# Patient Record
Sex: Female | Born: 1970
Health system: Southern US, Community
[De-identification: ages and names within clinical notes are randomized; demographics above are authoritative.]

## PROBLEM LIST (undated history)

## (undated) DIAGNOSIS — F3181 Bipolar II disorder: Secondary | ICD-10-CM

## (undated) DIAGNOSIS — F909 Attention-deficit hyperactivity disorder, unspecified type: Secondary | ICD-10-CM

---

## 2007-12-29 ENCOUNTER — Encounter: Payer: Self-pay | Admitting: Family Medicine

## 2007-12-29 ENCOUNTER — Other Ambulatory Visit: Admission: RE | Admit: 2007-12-29 | Discharge: 2007-12-29 | Payer: Self-pay | Admitting: Family Medicine

## 2007-12-29 ENCOUNTER — Ambulatory Visit: Payer: Self-pay | Admitting: Family Medicine

## 2007-12-29 DIAGNOSIS — F3189 Other bipolar disorder: Secondary | ICD-10-CM | POA: Insufficient documentation

## 2007-12-29 DIAGNOSIS — D239 Other benign neoplasm of skin, unspecified: Secondary | ICD-10-CM | POA: Insufficient documentation

## 2007-12-29 DIAGNOSIS — B070 Plantar wart: Secondary | ICD-10-CM

## 2008-01-01 ENCOUNTER — Ambulatory Visit: Payer: Self-pay | Admitting: Family Medicine

## 2008-01-06 ENCOUNTER — Encounter (INDEPENDENT_AMBULATORY_CARE_PROVIDER_SITE_OTHER): Payer: Self-pay | Admitting: *Deleted

## 2008-01-24 LAB — CONVERTED CEMR LAB
Albumin: 4.2 g/dL (ref 3.5–5.2)
Alkaline Phosphatase: 53 units/L (ref 39–117)
BUN: 14 mg/dL (ref 6–23)
Calcium: 9.6 mg/dL (ref 8.4–10.5)
Eosinophils Absolute: 0 10*3/uL (ref 0.0–0.7)
Eosinophils Relative: 1 % (ref 0.0–5.0)
GFR calc Af Amer: 92 mL/min
GFR calc non Af Amer: 76 mL/min
HCT: 39.4 % (ref 36.0–46.0)
HDL: 49.8 mg/dL (ref >39.0)
MCV: 94 fL (ref 78.0–100.0)
Monocytes Absolute: 0.3 10*3/uL (ref 0.1–1.0)
Platelets: 179 10*3/uL (ref 150–400)
Potassium: 3.6 meq/L (ref 3.5–5.1)
RDW: 11.7 % (ref 11.5–14.6)
Sodium: 136 meq/L (ref 135–145)
TSH: 0.97 microintl units/mL (ref 0.35–5.50)
Triglycerides: 63 mg/dL (ref 0–149)

## 2008-01-27 ENCOUNTER — Encounter (INDEPENDENT_AMBULATORY_CARE_PROVIDER_SITE_OTHER): Payer: Self-pay | Admitting: *Deleted

## 2008-01-28 ENCOUNTER — Telehealth (INDEPENDENT_AMBULATORY_CARE_PROVIDER_SITE_OTHER): Payer: Self-pay | Admitting: *Deleted

## 2017-11-22 DIAGNOSIS — F909 Attention-deficit hyperactivity disorder, unspecified type: Secondary | ICD-10-CM | POA: Insufficient documentation

## 2017-11-22 DIAGNOSIS — F411 Generalized anxiety disorder: Secondary | ICD-10-CM | POA: Insufficient documentation

## 2017-12-03 ENCOUNTER — Ambulatory Visit: Payer: 59 | Admitting: Psychiatry

## 2017-12-03 ENCOUNTER — Encounter: Payer: Self-pay | Admitting: Psychiatry

## 2017-12-03 DIAGNOSIS — R7989 Other specified abnormal findings of blood chemistry: Secondary | ICD-10-CM

## 2017-12-03 DIAGNOSIS — F411 Generalized anxiety disorder: Secondary | ICD-10-CM

## 2017-12-03 DIAGNOSIS — F3181 Bipolar II disorder: Secondary | ICD-10-CM | POA: Diagnosis not present

## 2017-12-03 DIAGNOSIS — F902 Attention-deficit hyperactivity disorder, combined type: Secondary | ICD-10-CM

## 2017-12-03 MED ORDER — AMPHETAMINE-DEXTROAMPHETAMINE 20 MG PO TABS: 20.0000 mg | ORAL_TABLET | ORAL | 0 refills | Status: DC

## 2017-12-03 MED ORDER — LISDEXAMFETAMINE DIMESYLATE 50 MG PO CAPS: 50.0000 mg | ORAL_CAPSULE | ORAL | 0 refills | Status: DC

## 2017-12-03 MED ORDER — LAMOTRIGINE 150 MG PO TABS: 150.0000 mg | ORAL_TABLET | Freq: Every day | ORAL | 3 refills | Status: DC

## 2017-12-03 NOTE — Progress Notes (Signed)
[facility]  Patient ID: Ariana Montes,  MRN: [id number]  PCP: Ann Held, DO  Date of Evaluation: 12/03/2017 Time spent:20 minutes   HISTORY/CURRENT STATUS: HPI  FU psych dxes, seasonality noticed Notices ADD symptoms And wonders about longer acting stimulant.  However she is getting by with the current one and does not want to change Notices seasonal mood changes not severe. Patient reports stable mood and denies depressed or irritable moods.  Patient denies any recent difficulty with anxiety.  Patient denies difficulty with sleep initiation or maintenance. Denies appetite disturbance.  Patient reports that energy and motivation have been OK except a little seasonal drop.  Patient denies any difficulty with concentration.  Patient denies any suicidal ideation. Individual Medical History/ Review of Systems: Changes? :No  Allergies: Penicillins and Sulfa antibiotics  Current Medications:  Current Outpatient Medications:  .  amphetamine-dextroamphetamine (ADDERALL) 20 MG tablet, Take 20 mg by mouth every morning., Disp: , Rfl:  .  lamoTRIgine (LAMICTAL) 150 MG tablet, Take 150 mg by mouth daily., Disp: , Rfl:  .  lisdexamfetamine (VYVANSE) 50 MG capsule, Take 50 mg by mouth every morning., Disp: , Rfl:  Medication Side Effects: None  Family Medical/ Social History: Changes? No  MENTAL HEALTH EXAM:  There were no vitals taken for this visit.There is no height or weight on file to calculate BMI.  General Appearance: Casual  Eye Contact:  Good  Speech:  Normal Rate  Volume:  Normal  Mood:  Euthymic  Affect:  Appropriate and Congruent  Thought Process:  Goal Directed  Orientation:  Full (Time, Place, and Person)  Thought Content: Logical   Suicidal Thoughts:  No  Homicidal Thoughts:  No  Memory:  Recent  Judgement:  Good  Insight:  Good  Psychomotor Activity:  Normal  Concentration:  Concentration: Good  Recall:  Good  Fund of Knowledge: Good   Language: Good  Akathisia:  No  AIMS (if indicated): not done  Assets:  Communication Skills Desire for Improvement Financial Resources/Insurance Housing Intimacy Leisure Time Resilience Social Support Talents/Skills Transportation  ADL's:  Intact  Cognition: WNL  Prognosis:  Good    DIAGNOSES:    ICD-10-CM   1. Bipolar II disorder ([place]) F31.81   2. Generalized anxiety disorder F41.1   3. Low vitamin D level R79.89   4. Attention deficit hyperactivity disorder (ADHD), combined type F90.2     RECOMMENDATIONS: Greater than 50% of face to face time with patient was spent on counseling and coordination of care. We discussed Strong seasonality in mood and attention. Hx low vitamin D contributes to seasonal depressison.  Check level. Then add vitamin D .  Replace bulbs in her light box  Then use it during winter.  Handout given on it's use. Discussed alternatives in stimulants to go to one long acting..   FU 6 mos    Purnell Shoemaker, MD

## 2017-12-08 LAB — VITAMIN D 1,25 DIHYDROXY
Vitamin D 1, 25 (OH)2 Total: 50 pg/mL
Vitamin D3 1, 25 (OH)2: 50 pg/mL

## 2017-12-09 ENCOUNTER — Telehealth: Payer: Self-pay | Admitting: Psychiatry

## 2017-12-09 NOTE — Telephone Encounter (Signed)
Vitamin D level on supplementation is 50.  That's good.  Keep taking it. No change.

## 2017-12-11 ENCOUNTER — Telehealth: Payer: Self-pay

## 2017-12-11 NOTE — Telephone Encounter (Signed)
Called pt with Vitamin D level at 50, continue taking supplement per provider. Pt verbalized understanding.

## 2018-03-31 ENCOUNTER — Other Ambulatory Visit: Payer: Self-pay

## 2018-03-31 MED ORDER — LISDEXAMFETAMINE DIMESYLATE 50 MG PO CAPS: 50.0000 mg | ORAL_CAPSULE | ORAL | 0 refills | Status: DC

## 2018-03-31 MED ORDER — AMPHETAMINE-DEXTROAMPHETAMINE 20 MG PO TABS: 20.0000 mg | ORAL_TABLET | ORAL | 0 refills | Status: DC

## 2018-03-31 NOTE — Telephone Encounter (Signed)
Received refill request from [facility] for pt's Vyvanse and Amphetamine be submitted for 90 day supply.

## 2018-06-04 ENCOUNTER — Encounter: Payer: Self-pay | Admitting: Psychiatry

## 2018-06-04 ENCOUNTER — Ambulatory Visit: Payer: 59 | Admitting: Psychiatry

## 2018-06-04 ENCOUNTER — Other Ambulatory Visit: Payer: Self-pay

## 2018-06-04 DIAGNOSIS — F3181 Bipolar II disorder: Secondary | ICD-10-CM

## 2018-06-04 DIAGNOSIS — R7989 Other specified abnormal findings of blood chemistry: Secondary | ICD-10-CM | POA: Diagnosis not present

## 2018-06-04 DIAGNOSIS — G4721 Circadian rhythm sleep disorder, delayed sleep phase type: Secondary | ICD-10-CM

## 2018-06-04 DIAGNOSIS — F902 Attention-deficit hyperactivity disorder, combined type: Secondary | ICD-10-CM

## 2018-06-04 DIAGNOSIS — F411 Generalized anxiety disorder: Secondary | ICD-10-CM | POA: Diagnosis not present

## 2018-06-04 MED ORDER — LISDEXAMFETAMINE DIMESYLATE 50 MG PO CAPS: 50.0000 mg | ORAL_CAPSULE | ORAL | 0 refills | Status: DC

## 2018-06-04 NOTE — Progress Notes (Signed)
Ariana Montes [id number] [birth year] 48 y.o.  Virtual Visit via Telephone Note  I connected with pt by telephone and verified that I am speaking with the correct person using two identifiers.   I discussed the limitations, risks, security and privacy concerns of performing an evaluation and management service by telephone and the availability of in person appointments. I also discussed with the patient that there may be a patient responsible charge related to this service. The patient expressed understanding and agreed to proceed.  I discussed the assessment and treatment plan with the patient. The patient was provided an opportunity to ask questions and all were answered. The patient agreed with the plan and demonstrated an understanding of the instructions.   The patient was advised to call back or seek an in-person evaluation if the symptoms worsen or if the condition fails to improve as anticipated.  I provided 30 minutes of non-face-to-face time during this encounter.  The patient was located at home and the provider was located offcie.  Subjective:   Patient ID:  Ariana Montes is a 48 y.o. (DOB 05/01/1970) female.  Chief Complaint:  Chief Complaint  Patient presents with  . Follow-up    Medication Management  . Anxiety    Medication Management    HPI Ariana Montes was last seen December 03, 2017.  She is being followed up for bipolar 2 disorder generalized anxiety disorder ADD and low vitamin D level.  We checked her vitamin D level since the last appointment and it was 50 which is acceptable.  Recently more stressful with stay at home but pretty well overall.  4 kids that also struggle with ADD and anxiety is stressful.  Doing the best I can.  Fear of Covid in [facility].  Some anxiety over it.  Easily stressed but prefers to avoid more meds.  Wonders about Ativan Rx.  Kids do see her anxious.  Sleep inconsistent bc kids out of school.  She doesn't want to deal with it right now.   Quantity of sleep is good.  Maybe it would help to use light box.  May have to switch to Adderall bc of cost.  She'll let us know  Patient reports stable mood and denies depressed or irritable moods.   Patient denies difficulty with sleep initiation or maintenance. Denies appetite disturbance.  Patient reports that energy and motivation have been good.  Patient still has residual Add causing some difficulty with concentration.  But the meds help.  Patient denies any suicidal ideation.  Otherwise satisfied with meds.  Past Psychiatric Medication Trials: Vyvanse 50 with Adderall 20 lamotrigine 150, Trileptal 211, Concerta 36, buspirone 15 twice daily, Abilify plus Topamax caused tiredness, Seroquel, Wellbutrin, Lexapro, Effexor, SSRIs with a history of causing suicidal thoughts and mania, Ativan    Review of Systems:  Review of Systems  Neurological: Negative for tremors and weakness.  Psychiatric/Behavioral: Negative for agitation, behavioral problems, confusion, decreased concentration, dysphoric mood, hallucinations, self-injury and suicidal ideas. The patient is nervous/anxious. The patient is not hyperactive.     Medications: I have reviewed the patient's current medications.  Current Outpatient Medications  Medication Sig Dispense Refill  . amphetamine-dextroamphetamine (ADDERALL) 20 MG tablet Take 1 tablet (20 mg total) by mouth every morning. 90 tablet 0  . lamoTRIgine (LAMICTAL) 150 MG tablet Take 1 tablet (150 mg total) by mouth daily. 90 tablet 3  . lisdexamfetamine (VYVANSE) 50 MG capsule Take 1 capsule (50 mg total) by mouth every morning. 90 capsule 0  .  loratadine (CLARITIN) 10 MG tablet Take 10 mg by mouth daily.     No current facility-administered medications for this visit.     Medication Side Effects: None  Allergies:  Allergies  Allergen Reactions  . Penicillins Rash  . Sulfa Antibiotics Rash  . Tape Rash    History reviewed. No pertinent past medical  history.  History reviewed. No pertinent family history.  Social History   Socioeconomic History  . Marital status: Married    Spouse name: Not on file  . Number of children: Not on file  . Years of education: Not on file  . Highest education level: Not on file  Occupational History  . Not on file  Social Needs  . Financial resource strain: Not on file  . Food insecurity:    Worry: Not on file    Inability: Not on file  . Transportation needs:    Medical: Not on file    Non-medical: Not on file  Tobacco Use  . Smoking status: Former Research scientist (life sciences)  . Smokeless tobacco: Never Used  Substance and Sexual Activity  . Alcohol use: Not on file  . Drug use: Not on file  . Sexual activity: Not on file  Lifestyle  . Physical activity:    Days per week: Not on file    Minutes per session: Not on file  . Stress: Not on file  Relationships  . Social connections:    Talks on phone: Not on file    Gets together: Not on file    Attends religious service: Not on file    Active member of club or organization: Not on file    Attends meetings of clubs or organizations: Not on file    Relationship status: Not on file  . Intimate partner violence:    Fear of current or ex partner: Not on file    Emotionally abused: Not on file    Physically abused: Not on file    Forced sexual activity: Not on file  Other Topics Concern  . Not on file  Social History Narrative  . Not on file    Past Medical History, Surgical history, Social history, and Family history were reviewed and updated as appropriate.   Please see review of systems for further details on the patient's review from today.   Objective:   Physical Exam:  There were no vitals taken for this visit.  Physical Exam Neurological:     Mental Status: She is alert and oriented to person, place, and time.     Cranial Nerves: No dysarthria.  Psychiatric:        Attention and Perception: Attention normal. She does not perceive auditory  hallucinations.        Mood and Affect: Mood is anxious. Mood is not depressed or elated. Affect is not tearful.        Speech: Speech normal.        Behavior: Behavior is cooperative.        Thought Content: Thought content normal. Thought content is not paranoid or delusional. Thought content does not include homicidal or suicidal ideation. Thought content does not include homicidal or suicidal plan.        Cognition and Memory: Cognition and memory normal.        Judgment: Judgment normal.     Comments: Talkative with a lot of questions but directable.  Not manic.  Insight fair to good     Lab Review:     Component Value  Date/Time   NA 136 01/01/2008 1136   K 3.6 01/01/2008 1136   CL 100 01/01/2008 1136   CO2 25 01/01/2008 1136   GLUCOSE 71 01/01/2008 1136   BUN 14 01/01/2008 1136   CREATININE 0.9 01/01/2008 1136   CALCIUM 9.6 01/01/2008 1136   PROT 7.2 01/01/2008 1136   ALBUMIN 4.2 01/01/2008 1136   AST 30 01/01/2008 1136   ALT 45 (H) 01/01/2008 1136   ALKPHOS 53 01/01/2008 1136   BILITOT 0.8 01/01/2008 1136   GFRNONAA 76 01/01/2008 1136   GFRAA 92 01/01/2008 1136       Component Value Date/Time   WBC 3.6 (L) 01/01/2008 1136   RBC 4.19 01/01/2008 1136   HGB 13.9 01/01/2008 1136   HCT 39.4 01/01/2008 1136   PLT 179 01/01/2008 1136   MCV 94.0 01/01/2008 1136   MCHC 35.3 01/01/2008 1136   RDW 11.7 01/01/2008 1136   MONOABS 0.3 01/01/2008 1136   EOSABS 0.0 01/01/2008 1136   BASOSABS 0.0 01/01/2008 1136    No results found for: POCLITH, LITHIUM   No results found for: PHENYTOIN, PHENOBARB, VALPROATE, CBMZ   .res Assessment: Plan:    Bipolar II disorder ([place])  Generalized anxiety disorder  Attention deficit hyperactivity disorder (ADHD), combined type  Low vitamin D level  Delayed sleep phase syndrome   Disc risk inadequate sleep can trigger mania and encourage sleep hygiene.  Disc at length the use of the light box to  Help regulate sleep hygiene and  cycle.   Asks about melatonin.  Disc dosing options and controversy at length.  Still has ADD but benefit from meds.  She will call back if she decides she has to switch to Adderall only for cost reasons.  Anxiety is there and worse but manageable.  May ask for occ Ativan later and rare use is ok.  Benefit from lamotrigine and she does not want to add additional mood stabilizers.  She prefers minimal medication.  Supportive therapy around anxiety and managing kids with anxiety and ADD during the Covid epidemic.  Follow-up 6 months or earlier if there are mood swings or meds are needed to be changed.  Lynder Parents MD, DFAPA  Please see After Visit Summary for patient specific instructions.  No future appointments.  No orders of the defined types were placed in this encounter.     -------------------------------

## 2018-06-26 ENCOUNTER — Other Ambulatory Visit: Payer: Self-pay

## 2018-06-26 MED ORDER — AMPHETAMINE-DEXTROAMPHETAMINE 20 MG PO TABS: 20.0000 mg | ORAL_TABLET | ORAL | 0 refills | Status: DC

## 2018-12-03 ENCOUNTER — Ambulatory Visit: Payer: Self-pay | Admitting: Psychiatry

## 2018-12-25 ENCOUNTER — Ambulatory Visit (INDEPENDENT_AMBULATORY_CARE_PROVIDER_SITE_OTHER): Payer: 59 | Admitting: Psychiatry

## 2018-12-25 ENCOUNTER — Encounter: Payer: Self-pay | Admitting: Psychiatry

## 2018-12-25 ENCOUNTER — Other Ambulatory Visit: Payer: Self-pay

## 2018-12-25 DIAGNOSIS — F902 Attention-deficit hyperactivity disorder, combined type: Secondary | ICD-10-CM | POA: Diagnosis not present

## 2018-12-25 DIAGNOSIS — G4721 Circadian rhythm sleep disorder, delayed sleep phase type: Secondary | ICD-10-CM | POA: Diagnosis not present

## 2018-12-25 DIAGNOSIS — F411 Generalized anxiety disorder: Secondary | ICD-10-CM | POA: Diagnosis not present

## 2018-12-25 DIAGNOSIS — F3181 Bipolar II disorder: Secondary | ICD-10-CM | POA: Diagnosis not present

## 2018-12-25 DIAGNOSIS — R7989 Other specified abnormal findings of blood chemistry: Secondary | ICD-10-CM

## 2018-12-25 MED ORDER — AMPHETAMINE-DEXTROAMPHETAMINE 20 MG PO TABS: 20.0000 mg | ORAL_TABLET | Freq: Three times a day (TID) | ORAL | 0 refills | Status: AC

## 2018-12-25 NOTE — Progress Notes (Signed)
Ariana Montes [id number] 1970/12/12 48 y.o.  Virtual Visit via Telephone Note  I connected with pt by telephone and verified that I am speaking with the correct person using two identifiers.   I discussed the limitations, risks, security and privacy concerns of performing an evaluation and management service by telephone and the availability of in person appointments. I also discussed with the patient that there may be a patient responsible charge related to this service. The patient expressed understanding and agreed to proceed.  I discussed the assessment and treatment plan with the patient. The patient was provided an opportunity to ask questions and all were answered. The patient agreed with the plan and demonstrated an understanding of the instructions.   The patient was advised to call back or seek an in-person evaluation if the symptoms worsen or if the condition fails to improve as anticipated.  I provided 30 minutes of non-face-to-face time during this encounter.  The patient was located at home and the provider was located offcie.  Subjective:   Patient ID:  Ariana Montes is a 48 y.o. (DOB 12/12/1970) female.  Chief Complaint:  Chief Complaint  Patient presents with  . Follow-up    Medication Management  . Anxiety    Medication Management  . ADHD    Medication Management  . Other    Bipolar 2    Anxiety Symptoms include nervous/anxious behavior. Patient reports no confusion, decreased concentration or suicidal ideas.     [place]i .  She is being followed up for bipolar 2 disorder generalized anxiety disorder ADD and low vitamin D level.  We checked her vitamin D level  it was 50 which is acceptable.  Last seen April 2020.  No meds were changed.  Dealing with kids and H working from home and having privacy.    Riding the normal wave.  Nothing severe or extreme.  Days of overwhelmed [place] Cov[place].    Some days of higher anxiety.  Most of responsibilities at home so  no outside demands. 4 kids that also struggle with ADD and anxiety is stressful.  Doing the best I can.  Fear of Covid in [facility].  Some anxiety over it.   Wonders about Ativan Rx.  Kids do see her anxious.  Sleep inconsistent bc kids out of school.  She doesn't want to deal with it right now.  Quantity of sleep is good.    Maybe it would help to use light box.  Is noticing more seasonal depression and it's really challenging this year already.  Some years it's OK.  That is hard.    Also intuitionally struggling with more ADD.  May have to switch to Adderall bc of cost.  Not much crash with Adderall.  D 48 yo in school.  Middle D auditory processing issues.  Boys with ADD.  Patient reports stable mood and denies depressed or irritable moods.   Patient denies difficulty with sleep initiation or maintenance. Denies appetite disturbance.  Patient reports that energy and motivation have been good.  Patient still has residual Add causing some difficulty with concentration and maybe worse.  But the meds help.  Patient denies any suicidal ideation.  Otherwise satisfied with meds.  Past Psychiatric Medication Trials: Vyvanse 50 with Adderall 20, Concerta 36,  lamotrigine 150, Trileptal 450,  buspirone 15 twice daily, Abilify plus Topamax caused tiredness, Seroquel,   SSRIs with a history of causing suicidal thoughts and mania, Wellbutrin, Lexapro, Effexor, Ativan  Review of Systems:  Review  of Systems  Neurological: Negative for tremors and weakness.  Psychiatric/Behavioral: Negative for agitation, behavioral problems, confusion, decreased concentration, dysphoric mood, hallucinations, self-injury and suicidal ideas. The patient is nervous/anxious. The patient is not hyperactive.     Medications: I have reviewed the patient's current medications.  Current Outpatient Medications  Medication Sig Dispense Refill  . amphetamine-dextroamphetamine (ADDERALL) 20 MG tablet Take 1 tablet (20 mg total) by  mouth 3 (three) times daily. 270 tablet 0  . lamoTRIgine (LAMICTAL) 150 MG tablet Take 1 tablet (150 mg total) by mouth daily. 90 tablet 3  . loratadine (CLARITIN) 10 MG tablet Take 10 mg by mouth daily.     No current facility-administered medications for this visit.     Medication Side Effects: None  Allergies:  Allergies  Allergen Reactions  . Penicillins Rash  . Sulfa Antibiotics Rash  . Tape Rash    History reviewed. No pertinent past medical history.  History reviewed. No pertinent family history.  Social History   Socioeconomic History  . Marital status: Married    Spouse name: Not on file  . Number of children: Not on file  . Years of education: Not on file  . Highest education level: Not on file  Occupational History  . Not on file  Social Needs  . Financial resource strain: Not on file  . Food insecurity    Worry: Not on file    Inability: Not on file  . Transportation needs    Medical: Not on file    Non-medical: Not on file  Tobacco Use  . Smoking status: Former Research scientist (life sciences)  . Smokeless tobacco: Never Used  Substance and Sexual Activity  . Alcohol use: Not on file  . Drug use: Not on file  . Sexual activity: Not on file  Lifestyle  . Physical activity    Days per week: Not on file    Minutes per session: Not on file  . Stress: Not on file  Relationships  . Social Herbalist on phone: Not on file    Gets together: Not on file    Attends religious service: Not on file    Active member of club or organization: Not on file    Attends meetings of clubs or organizations: Not on file    Relationship status: Not on file  . Intimate partner violence    Fear of current or ex partner: Not on file    Emotionally abused: Not on file    Physically abused: Not on file    Forced sexual activity: Not on file  Other Topics Concern  . Not on file  Social History Narrative  . Not on file    Past Medical History, Surgical history, Social history, and  Family history were reviewed and updated as appropriate.   Please see review of systems for further details on the patient's review from today.   Objective:   Physical Exam:  There were no vitals taken for this visit.  Physical Exam Neurological:     Mental Status: She is alert and oriented to person, place, and time.     Cranial Nerves: No dysarthria.  Psychiatric:        Attention and Perception: Attention normal. She does not perceive auditory hallucinations.        Mood and Affect: Mood is anxious and depressed. Mood is not elated. Affect is not tearful.        Speech: Speech normal.  Behavior: Behavior is cooperative.        Thought Content: Thought content normal. Thought content is not paranoid or delusional. Thought content does not include homicidal or suicidal ideation. Thought content does not include homicidal or suicidal plan.        Cognition and Memory: Cognition and memory normal.        Judgment: Judgment normal.     Comments: Talkative with a lot of questions but directable.  Not manic.  Insight fair to good     Lab Review:     Component Value Date/Time   NA 136 01/01/2008 1136   K 3.6 01/01/2008 1136   CL 100 01/01/2008 1136   CO2 25 01/01/2008 1136   GLUCOSE 71 01/01/2008 1136   BUN 14 01/01/2008 1136   CREATININE 0.9 01/01/2008 1136   CALCIUM 9.6 01/01/2008 1136   PROT 7.2 01/01/2008 1136   ALBUMIN 4.2 01/01/2008 1136   AST 30 01/01/2008 1136   ALT 45 (H) 01/01/2008 1136   ALKPHOS 53 01/01/2008 1136   BILITOT 0.8 01/01/2008 1136   GFRNONAA 76 01/01/2008 1136   GFRAA 92 01/01/2008 1136       Component Value Date/Time   WBC 3.6 (L) 01/01/2008 1136   RBC 4.19 01/01/2008 1136   HGB 13.9 01/01/2008 1136   HCT 39.4 01/01/2008 1136   PLT 179 01/01/2008 1136   MCV 94.0 01/01/2008 1136   MCHC 35.3 01/01/2008 1136   RDW 11.7 01/01/2008 1136   MONOABS 0.3 01/01/2008 1136   EOSABS 0.0 01/01/2008 1136   BASOSABS 0.0 01/01/2008 1136    No  results found for: POCLITH, LITHIUM   No results found for: PHENYTOIN, PHENOBARB, VALPROATE, CBMZ   .res Assessment: Plan:    Bipolar II disorder ([place])  Generalized anxiety disorder  Attention deficit hyperactivity disorder (ADHD), combined type - Plan: amphetamine-dextroamphetamine (ADDERALL) 20 MG tablet  Delayed sleep phase syndrome  Low vitamin D level   Greater than 50% of 25 min face to face time with patient was spent on counseling and coordination of care. We discussed She is having more seasonal affective disorder symptoms.  Disc risk inadequate sleep can trigger mania and encourage sleep hygiene.  Disc at length the use of the light box to  Help regulate sleep hygiene and cycle.   Asks about melatonin.  Disc dosing options and controversy at length. Rec more discipline with light box for seasonal affective disorder. Discussed low vitamin D and increased risk of cognitive problems and seasonal affective disorder relationship.  It appears that we have the proper dose of vitamin D based on the last blood level  Still has ADD and is a little worse situationally.  Cost problems with Vyvanse.   Switch to ADDERALL 20 mg TID or 30 mg BID.  DC Vyvanse. Discussed potential benefits, risks, and side effects of stimulants with patient to include increased heart rate, palpitations, insomnia, increased anxiety, increased irritability, or decreased appetite.  Instructed patient to contact office if experiencing any significant tolerability issues.  Discussed the risk of triggering mania with stimulants as well.  Anxiety is there and worse but manageable.  May ask for occ Ativan later and rare use is ok.  Benefit from lamotrigine and she does not want to add additional mood stabilizers.   Trial increase lamotrigine for seasonal affective disorder to 225 mg daily.  Supportive therapy around anxiety and managing kids with anxiety and ADD during the Covid epidemic.  Follow-up 3 mos  Lynder Parents  MD, DFAPA  Please see After Visit Summary for patient specific instructions.  No future appointments.  No orders of the defined types were placed in this encounter.     -------------------------------

## 2019-01-14 ENCOUNTER — Other Ambulatory Visit: Payer: Self-pay | Admitting: Psychiatry

## 2019-08-25 ENCOUNTER — Other Ambulatory Visit: Payer: Self-pay | Admitting: Adult Health

## 2019-08-25 ENCOUNTER — Encounter (HOSPITAL_BASED_OUTPATIENT_CLINIC_OR_DEPARTMENT_OTHER): Payer: Self-pay | Admitting: *Deleted

## 2019-08-25 ENCOUNTER — Other Ambulatory Visit: Payer: Self-pay

## 2019-08-25 ENCOUNTER — Emergency Department (HOSPITAL_BASED_OUTPATIENT_CLINIC_OR_DEPARTMENT_OTHER): Payer: 59

## 2019-08-25 ENCOUNTER — Emergency Department (HOSPITAL_BASED_OUTPATIENT_CLINIC_OR_DEPARTMENT_OTHER)
Admission: EM | Admit: 2019-08-25 | Discharge: 2019-08-25 | Disposition: A | Discharge status: Home / Self Care | Payer: 59 | Attending: Emergency Medicine | Admitting: Emergency Medicine

## 2019-08-25 ENCOUNTER — Telehealth: Payer: Self-pay | Admitting: Adult Health

## 2019-08-25 DIAGNOSIS — Z87891 Personal history of nicotine dependence: Secondary | ICD-10-CM | POA: Diagnosis not present

## 2019-08-25 DIAGNOSIS — F909 Attention-deficit hyperactivity disorder, unspecified type: Secondary | ICD-10-CM | POA: Insufficient documentation

## 2019-08-25 DIAGNOSIS — N939 Abnormal uterine and vaginal bleeding, unspecified: Secondary | ICD-10-CM | POA: Insufficient documentation

## 2019-08-25 DIAGNOSIS — U071 COVID-19: Secondary | ICD-10-CM | POA: Insufficient documentation

## 2019-08-25 DIAGNOSIS — R509 Fever, unspecified: Secondary | ICD-10-CM | POA: Diagnosis present

## 2019-08-25 HISTORY — DX: Bipolar II disorder: F31.81

## 2019-08-25 HISTORY — DX: Attention-deficit hyperactivity disorder, unspecified type: F90.9

## 2019-08-25 LAB — URINALYSIS, MICROSCOPIC (REFLEX): RBC / HPF: >50 RBC/hpf (ref 0–5)

## 2019-08-25 LAB — CBC WITH DIFFERENTIAL/PLATELET
Abs Immature Granulocytes: 0.01 10*3/uL (ref 0.00–0.07)
Basophils Absolute: 0 10*3/uL (ref 0.0–0.1)
Basophils Relative: 0 %
Eosinophils Absolute: 0 10*3/uL (ref 0.0–0.5)
Eosinophils Relative: 0 %
HCT: 33.1 % — ABNORMAL LOW (ref 36.0–46.0)
Hemoglobin: 11.4 g/dL — ABNORMAL LOW (ref 12.0–15.0)
Immature Granulocytes: 0 %
Lymphocytes Relative: 21 %
Lymphs Abs: 0.6 10*3/uL — ABNORMAL LOW (ref 0.7–4.0)
MCH: 32.9 pg (ref 26.0–34.0)
MCHC: 34.4 g/dL (ref 30.0–36.0)
MCV: 95.7 fL (ref 80.0–100.0)
Monocytes Absolute: 0.2 10*3/uL (ref 0.1–1.0)
Monocytes Relative: 6 %
Neutro Abs: 1.9 10*3/uL (ref 1.7–7.7)
Neutrophils Relative %: 73 %
Platelets: 158 10*3/uL (ref 150–400)
RBC: 3.46 MIL/uL — ABNORMAL LOW (ref 3.87–5.11)
RDW: 11.9 % (ref 11.5–15.5)
WBC: 2.6 10*3/uL — ABNORMAL LOW (ref 4.0–10.5)
nRBC: 0 % (ref 0.0–0.2)

## 2019-08-25 LAB — URINALYSIS, ROUTINE W REFLEX MICROSCOPIC
Glucose, UA: NEGATIVE mg/dL
Ketones, ur: 40 mg/dL — AB
Leukocytes,Ua: NEGATIVE
Nitrite: NEGATIVE
Protein, ur: 100 mg/dL — AB
Specific Gravity, Urine: >1.03 — ABNORMAL HIGH (ref 1.005–1.030)
pH: 5.5 (ref 5.0–8.0)

## 2019-08-25 LAB — WET PREP, GENITAL
Clue Cells Wet Prep HPF POC: NONE SEEN
Sperm: NONE SEEN
Trich, Wet Prep: NONE SEEN
Yeast Wet Prep HPF POC: NONE SEEN

## 2019-08-25 LAB — COMPREHENSIVE METABOLIC PANEL
ALT: 41 U/L (ref 0–44)
AST: 34 U/L (ref 15–41)
Albumin: 3.4 g/dL — ABNORMAL LOW (ref 3.5–5.0)
Alkaline Phosphatase: 35 U/L — ABNORMAL LOW (ref 38–126)
Anion gap: 11 (ref 5–15)
BUN: 8 mg/dL (ref 6–20)
CO2: 23 mmol/L (ref 22–32)
Calcium: 8.1 mg/dL — ABNORMAL LOW (ref 8.9–10.3)
Chloride: 102 mmol/L (ref 98–111)
Creatinine, Ser: 0.75 mg/dL (ref 0.44–1.00)
GFR calc Af Amer: >60 mL/min (ref >60)
GFR calc non Af Amer: >60 mL/min (ref >60)
Glucose, Bld: 109 mg/dL — ABNORMAL HIGH (ref 70–99)
Potassium: 3.4 mmol/L — ABNORMAL LOW (ref 3.5–5.1)
Sodium: 136 mmol/L (ref 135–145)
Total Bilirubin: 0.5 mg/dL (ref 0.3–1.2)
Total Protein: 6.8 g/dL (ref 6.5–8.1)

## 2019-08-25 LAB — SARS CORONAVIRUS 2 BY RT PCR (HOSPITAL ORDER, PERFORMED IN ~~LOC~~ HOSPITAL LAB): SARS Coronavirus 2: POSITIVE — AB

## 2019-08-25 LAB — PREGNANCY, URINE: Preg Test, Ur: NEGATIVE

## 2019-08-25 LAB — LIPASE, BLOOD: Lipase: 20 U/L (ref 11–51)

## 2019-08-25 MED ORDER — MEGESTROL ACETATE 40 MG PO TABS
40.0000 mg | ORAL_TABLET | Freq: Every day | ORAL | 0 refills | Status: AC
Start: 2019-08-25 — End: 2019-08-30

## 2019-08-25 MED ORDER — IBUPROFEN 800 MG PO TABS
800.0000 mg | ORAL_TABLET | Freq: Once | ORAL | Status: AC
  Administered 2019-08-25: 800 mg via ORAL
  Filled 2019-08-25: qty 1

## 2019-08-25 MED ORDER — SODIUM CHLORIDE 0.9 % IV BOLUS (SEPSIS)
1000.0000 mL | Freq: Once | INTRAVENOUS | Status: AC
  Administered 2019-08-25: 1000 mL via INTRAVENOUS

## 2019-08-25 MED ORDER — MEGESTROL ACETATE 40 MG PO TABS
40.0000 mg | ORAL_TABLET | Freq: Every day | ORAL | Status: DC
  Filled 2019-08-25: qty 1

## 2019-08-25 MED FILL — MEGESTROL 40 MG TABLET: 40 | 5 days supply | Qty: 5 | Fill #0

## 2019-08-25 NOTE — ED Notes (Signed)
Attempt IV access unsuccessful x1 will request RN attempt with u/s

## 2019-08-25 NOTE — ED Provider Notes (Signed)
[facility] EMERGENCY DEPARTMENT Provider Note   CSN: [id number] Arrival date & time: 08/25/19  1102     History No chief complaint on file.   Ariana Montes is a 49 y.o. female.  Patient is a 49 year old female with past medical history of bipolar disorder, ADHD presenting to the emergency department for fever, vaginal bleeding and generally feeling unwell.  Patient reports that for about 1 week she has had a fever as high as 102 and feeling generally unwell and fatigued.  Also reports that when her period came on the ninth of this month it has been much more heavy than normal.  It has come on time but she has been using many more pads and tampons than usual.  Currently she feels like it slowed up just a little bit but was heavy to the point where she was using more than 1 pad per hour.  She reports that her kids at home are sick with some sort of viral infection but "this is not the same thing".  She denies any abdominal pain, diarrhea, vomiting, chest pain, shortness of breath.  She does have a slight cough but no chest pain or SOB.        Past Medical History:  Diagnosis Date   ADHD    Bipolar 2 disorder [facility] [address]     Patient Active Problem List   Diagnosis Date Noted   GAD (generalized anxiety disorder) 11/22/2017   Attention deficit hyperactivity disorder (ADHD) 11/22/2017   PLANTAR WART, RIGHT 12/29/2007   MOLE 12/29/2007   BIPOLAR II DISORDER 12/29/2007    History reviewed. No pertinent surgical history.   OB History   No obstetric history on file.     No family history on file.  Social History   Tobacco Use   Smoking status: Former Smoker   Smokeless tobacco: Never Used  Substance Use Topics   Alcohol use: Not Currently   Drug use: Never    Home Medications Prior to Admission medications   Medication Sig Start Date End Date Taking? Authorizing Provider  amphetamine-dextroamphetamine (ADDERALL) 20 MG tablet Take 1 tablet (20 mg total)  by mouth 3 (three) times daily. 12/25/18  Yes Cottle, Billey Co., MD  lamoTRIgine (LAMICTAL) 150 MG tablet TAKE 1 TABLET BY MOUTH  DAILY 01/14/19  Yes Cottle, Billey Co., MD  loratadine (CLARITIN) 10 MG tablet Take 10 mg by mouth daily.    [provider]  megestrol (MEGACE) 40 MG tablet Take 1 tablet (40 mg total) by mouth daily for 5 days. 08/25/19 08/30/19  Alveria Apley, PA-C    Allergies    Penicillins, Sulfa antibiotics, and Tape  Review of Systems   Review of Systems  Constitutional: Positive for appetite change, chills, fatigue and fever.  HENT: Negative.   Respiratory: Positive for cough.   Cardiovascular: Negative for chest pain.  Gastrointestinal: Negative for abdominal pain, diarrhea, nausea and vomiting.  Genitourinary: Positive for vaginal bleeding. Negative for decreased urine volume, dysuria, flank pain, vaginal discharge and vaginal pain.  Musculoskeletal: Negative.   Skin: Negative for rash.  Neurological: Negative for dizziness, light-headedness, numbness and headaches.  Psychiatric/Behavioral: Agitation: .kmdi.  All other systems reviewed and are negative.   Physical Exam Updated Vital Signs BP 127/79 (BP Location: Right Wrist)    Pulse (!) 112    Temp 100 F (37.8 C) (Oral)    Resp 20    Ht 5\' 7"  (1.702 m)    Wt (!) 153.6 kg  LMP 08/21/2019    SpO2 98%    BMI 53.05 kg/m   Physical Exam Vitals and nursing note reviewed. Exam conducted with a chaperone present.  Constitutional:      General: She is not in acute distress.    Appearance: She is obese. She is not ill-appearing, toxic-appearing or diaphoretic.  HENT:     Head: Normocephalic and atraumatic.     Mouth/Throat:     Mouth: Mucous membranes are moist.  Eyes:     Conjunctiva/sclera: Conjunctivae normal.     Pupils: Pupils are equal, round, and reactive to light.  Cardiovascular:     Rate and Rhythm: Normal rate and regular rhythm.     Pulses: Normal pulses.  Pulmonary:     Effort:  Pulmonary effort is normal.     Breath sounds: Normal breath sounds.  Abdominal:     Palpations: Abdomen is soft.     Tenderness: There is no abdominal tenderness. There is no guarding or rebound.  Skin:    Capillary Refill: Capillary refill takes less than 2 seconds.     Coloration: Skin is pale.  Neurological:     Mental Status: She is alert and oriented to person, place, and time.  Psychiatric:        Mood and Affect: Mood normal.     ED Results / Procedures / Treatments   Labs (all labs ordered are listed, but only abnormal results are displayed) Labs Reviewed  SARS CORONAVIRUS 2 BY RT PCR ([facility]) - Abnormal; Notable for the following components:      Result Value   SARS Coronavirus 2 POSITIVE (*)    All other components within normal limits  WET PREP, GENITAL - Abnormal; Notable for the following components:   WBC, Wet Prep HPF POC FEW (*)    All other components within normal limits  CBC WITH DIFFERENTIAL/PLATELET - Abnormal; Notable for the following components:   WBC 2.6 (*)    RBC 3.46 (*)    Hemoglobin 11.4 (*)    HCT 33.1 (*)    Lymphs Abs 0.6 (*)    All other components within normal limits  COMPREHENSIVE METABOLIC PANEL - Abnormal; Notable for the following components:   Potassium 3.4 (*)    Glucose, Bld 109 (*)    Calcium 8.1 (*)    Albumin 3.4 (*)    Alkaline Phosphatase 35 (*)    All other components within normal limits  URINALYSIS, ROUTINE W REFLEX MICROSCOPIC - Abnormal; Notable for the following components:   APPearance CLOUDY (*)    Specific Gravity, Urine >1.030 (*)    Hgb urine dipstick LARGE (*)    Bilirubin Urine SMALL (*)    Ketones, ur 40 (*)    Protein, ur 100 (*)    All other components within normal limits  URINALYSIS, MICROSCOPIC (REFLEX) - Abnormal; Notable for the following components:   Bacteria, UA RARE (*)    All other components within normal limits  URINE CULTURE  LIPASE, BLOOD    PREGNANCY, URINE  GC/CHLAMYDIA PROBE AMP ([place]) NOT AT [facility]    EKG None  Radiology DG Chest Portable 1 View  Result Date: 08/25/2019 CLINICAL DATA:  Cough, fever for 1 week EXAM: PORTABLE CHEST 1 VIEW COMPARISON:  None. FINDINGS: The heart size and mediastinal contours are within normal limits. No focal airspace consolidation, pleural effusion, or pneumothorax. The visualized skeletal structures are unremarkable. IMPRESSION: No active disease. Electronically Signed  By: Davina Poke D.O.   On: 08/25/2019 12:24    Procedures Procedures (including critical care time)  Medications Ordered in ED Medications  megestrol (MEGACE) tablet 40 mg (has no administration in time range)  sodium chloride 0.9 % bolus 1,000 mL (0 mLs Intravenous Stopped 08/25/19 1549)  ibuprofen (ADVIL) tablet 800 mg (800 mg Oral Given 08/25/19 1453)    ED Course  I have reviewed the triage vital signs and the nursing notes.  Pertinent labs & imaging results that were available during my care of the patient were reviewed by me and considered in my medical decision making (see chart for details).  Clinical Course as of Aug 25 1611  Tue Aug 25, 2019  1347 Patient presenting with fatigue and fever and vaginal bleeding for about 1 week. Covid + here in the ED. Labs and chest xray reassuring. She was mildly tachycardic and reported decreased PO intake so IV fluids were given for mild dehydration.    [KM]  0165 I called the Covid medical antibody infusion center and left a message resolve her inflammation.  They should be following up with her to start infusion therapy if she wishes.  On my pelvic exam she did have moderate bleeding with some clots.  Due to her body habitus her cervix is only partially visualized and was normal.  She did not have any pain. Her cycle has come on at her usual interval and bleeding has been for 4 days so far. She reports it is lightening up compared to earlier.  We will refer her  to OB/GYN.  Patient was adamant about starting something that would help with the bleeding. She is hemodynamically stable. Hemoglobin of 11.4, no recent to compare to. Will start low dose megace and f/u GYN. She was advised on strict return precautions. Advised to monitor her covid symptoms at home and if possible, obtain pulse ox to check her oxygen level and return with any new or worsening symptoms. She has not had any SOB while here, chest xray clear, oxygen sats normal.    [KM]    Clinical Course User Index [KM] Kristine Royal   MDM Rules/Calculators/A&P                          Based on review of vitals, medical screening exam, lab work and/or imaging, there does not appear to be an acute, emergent etiology for the patient's symptoms. Counseled pt on good return precautions and encouraged both PCP and ED follow-up as needed.  Prior to discharge, I also discussed incidental imaging findings with patient in detail and advised appropriate, recommended follow-up in detail.  Clinical Impression: 1. COVID-19   2. Vaginal bleeding     Disposition: Discharge  Prior to providing a prescription for a controlled substance, I independently reviewed the patient's recent prescription history on the [facility]. The patient had no recent or regular prescriptions and was deemed appropriate for a brief, less than 3 day prescription of narcotic for acute analgesia.  This note was prepared with assistance of Systems analyst. Occasional wrong-word or sound-a-like substitutions may have occurred due to the inherent limitations of voice recognition software.  Final Clinical Impression(s) / ED Diagnoses Final diagnoses:  COVID-19  Vaginal bleeding    Rx / DC Orders ED Discharge Orders         Ordered    megestrol (MEGACE) 40 MG tablet  Daily  Discontinue  Reprint     08/25/19 1602           Alveria Apley, PA-C 08/25/19  1613    Tegeler, Gwenyth Allegra, MD 08/26/19 [phone]

## 2019-08-25 NOTE — Discharge Instructions (Addendum)
You were diagnosed with Covid 19 today. The remainder of your labs were reassuring. You are heaving heavier than normal vaginal bleeding. We have started you on megace for 5 days to slow the bleeding. If it does not improve or if it worsens you need to return to the ER. Monitor you covid 19 symptoms. If you have difficulty breathing or if you monitor you oxygen with a pulse oximeter (available for purchase at most drug stores) and your oxygen drops lower than 90% then please return to he the ER. The monoclonal antibody infusion center will contact you to set up infusions and give you more information about these for treatment. It is ok to take motrin or tylenol for pain and fever. Stay hydrated with fluids.

## 2019-08-25 NOTE — ED Triage Notes (Signed)
Fever for a week, cough, fatigue, chills, heavy vaginal bleeding x 3 days.

## 2019-08-25 NOTE — Telephone Encounter (Signed)
Called and [facility] for patient to return my call to discuss MOAB therapy as referred by ED.  My chart message sent.  Call back number [phone] given.    Wilber Bihari, NP

## 2019-08-25 NOTE — ED Notes (Signed)
ED Provider at bedside. 

## 2019-08-25 NOTE — ED Notes (Signed)
Pt aware of need for urine specimen.  Unable to provide at this time, IV fluids infusing

## 2019-08-25 NOTE — Progress Notes (Signed)
I connected by phone with Ariana Montes on 08/25/2019 at 8:38 PM to discuss the potential use of a new treatment for mild to moderate COVID-19 viral infection in non-hospitalized patients.  This patient is a 49 y.o. female that meets the FDA criteria for Emergency Use Authorization of COVID monoclonal antibody casirivimab/imdevimab.  Has a (+) direct SARS-CoV-2 viral test result  Has mild or moderate COVID-19   Is NOT hospitalized due to COVID-19  Is within 10 days of symptom onset  Has at least one of the high risk factor(s) for progression to severe COVID-19 and/or hospitalization as defined in EUA.  Specific high risk criteria : BMI > 25   I have spoken and communicated the following to the patient or parent/caregiver regarding COVID monoclonal antibody treatment:  1. FDA has authorized the emergency use for the treatment of mild to moderate COVID-19 in adults and pediatric patients with positive results of direct SARS-CoV-2 viral testing who are 57 years of age and older weighing at least 40 kg, and who are at high risk for progressing to severe COVID-19 and/or hospitalization.  2. The significant known and potential risks and benefits of COVID monoclonal antibody, and the extent to which such potential risks and benefits are unknown.  3. Information on available alternative treatments and the risks and benefits of those alternatives, including clinical trials.  4. Patients treated with COVID monoclonal antibody should continue to self-isolate and use infection control measures (e.g., wear mask, isolate, social distance, avoid sharing personal items, clean and disinfect "high touch" surfaces, and frequent handwashing) according to CDC guidelines.   5. The patient or parent/caregiver has the option to accept or refuse COVID monoclonal antibody treatment.  After reviewing this information with the patient, The patient agreed to proceed with receiving casirivimab\imdevimab infusion and  will be provided a copy of the Fact sheet prior to receiving the infusion. Scot Dock 08/25/2019 8:38 PM

## 2019-08-26 LAB — GC/CHLAMYDIA PROBE AMP (~~LOC~~) NOT AT ARMC
Chlamydia: NEGATIVE
Comment: NEGATIVE
Comment: NORMAL
Neisseria Gonorrhea: NEGATIVE

## 2019-08-26 MED ORDER — SODIUM CHLORIDE 0.9 % IV SOLN
Freq: Once | INTRAVENOUS | Status: AC
  Filled 2019-08-26: qty 600

## 2019-08-27 ENCOUNTER — Ambulatory Visit (HOSPITAL_COMMUNITY)
Admission: RE | Admit: 2019-08-27 | Discharge: 2019-08-27 | Disposition: A | Discharge status: Home / Self Care | Payer: 59 | Source: Ambulatory Visit | Attending: Pulmonary Disease | Admitting: Pulmonary Disease

## 2019-08-27 DIAGNOSIS — U071 COVID-19: Secondary | ICD-10-CM

## 2019-08-27 LAB — URINE CULTURE

## 2019-08-27 MED ORDER — DIPHENHYDRAMINE HCL 50 MG/ML IJ SOLN: 50.0000 mg | Freq: Once | INTRAMUSCULAR | Status: DC | PRN

## 2019-08-27 MED ORDER — EPINEPHRINE 0.3 MG/0.3ML IJ SOAJ: 0.3000 mg | Freq: Once | INTRAMUSCULAR | Status: DC | PRN

## 2019-08-27 MED ORDER — SODIUM CHLORIDE 0.9 % IV SOLN: INTRAVENOUS | Status: DC | PRN

## 2019-08-27 MED ORDER — ALBUTEROL SULFATE HFA 108 (90 BASE) MCG/ACT IN AERS: 2.0000 | INHALATION_SPRAY | Freq: Once | RESPIRATORY_TRACT | Status: DC | PRN

## 2019-08-27 MED ORDER — METHYLPREDNISOLONE SODIUM SUCC 125 MG IJ SOLR: 125.0000 mg | Freq: Once | INTRAMUSCULAR | Status: DC | PRN

## 2019-08-27 MED ORDER — FAMOTIDINE IN NACL 20-0.9 MG/50ML-% IV SOLN: 20.0000 mg | Freq: Once | INTRAVENOUS | Status: DC | PRN

## 2019-08-27 NOTE — Progress Notes (Signed)
  Diagnosis: COVID-19  Physician: Dr. Asencion Noble  Procedure: [facility] Infusion Clinic Med: casirivimab\imdevimab infusion - Provided patient with casirivimab\imdevimab fact sheet for patients, parents and caregivers prior to infusion.  Complications: No immediate complications noted.  Discharge: Discharged home   Ariana Montes 08/27/2019

## 2019-08-27 NOTE — Discharge Instructions (Signed)

## 2020-11-10 IMAGING — DX DG CHEST 1V PORT
1 series · 1 of 1 positions shown · non-contrast
Comparison: None.

CLINICAL DATA: Cough, fever for 1 week

EXAM:
PORTABLE CHEST 1 VIEW

[chest ap]
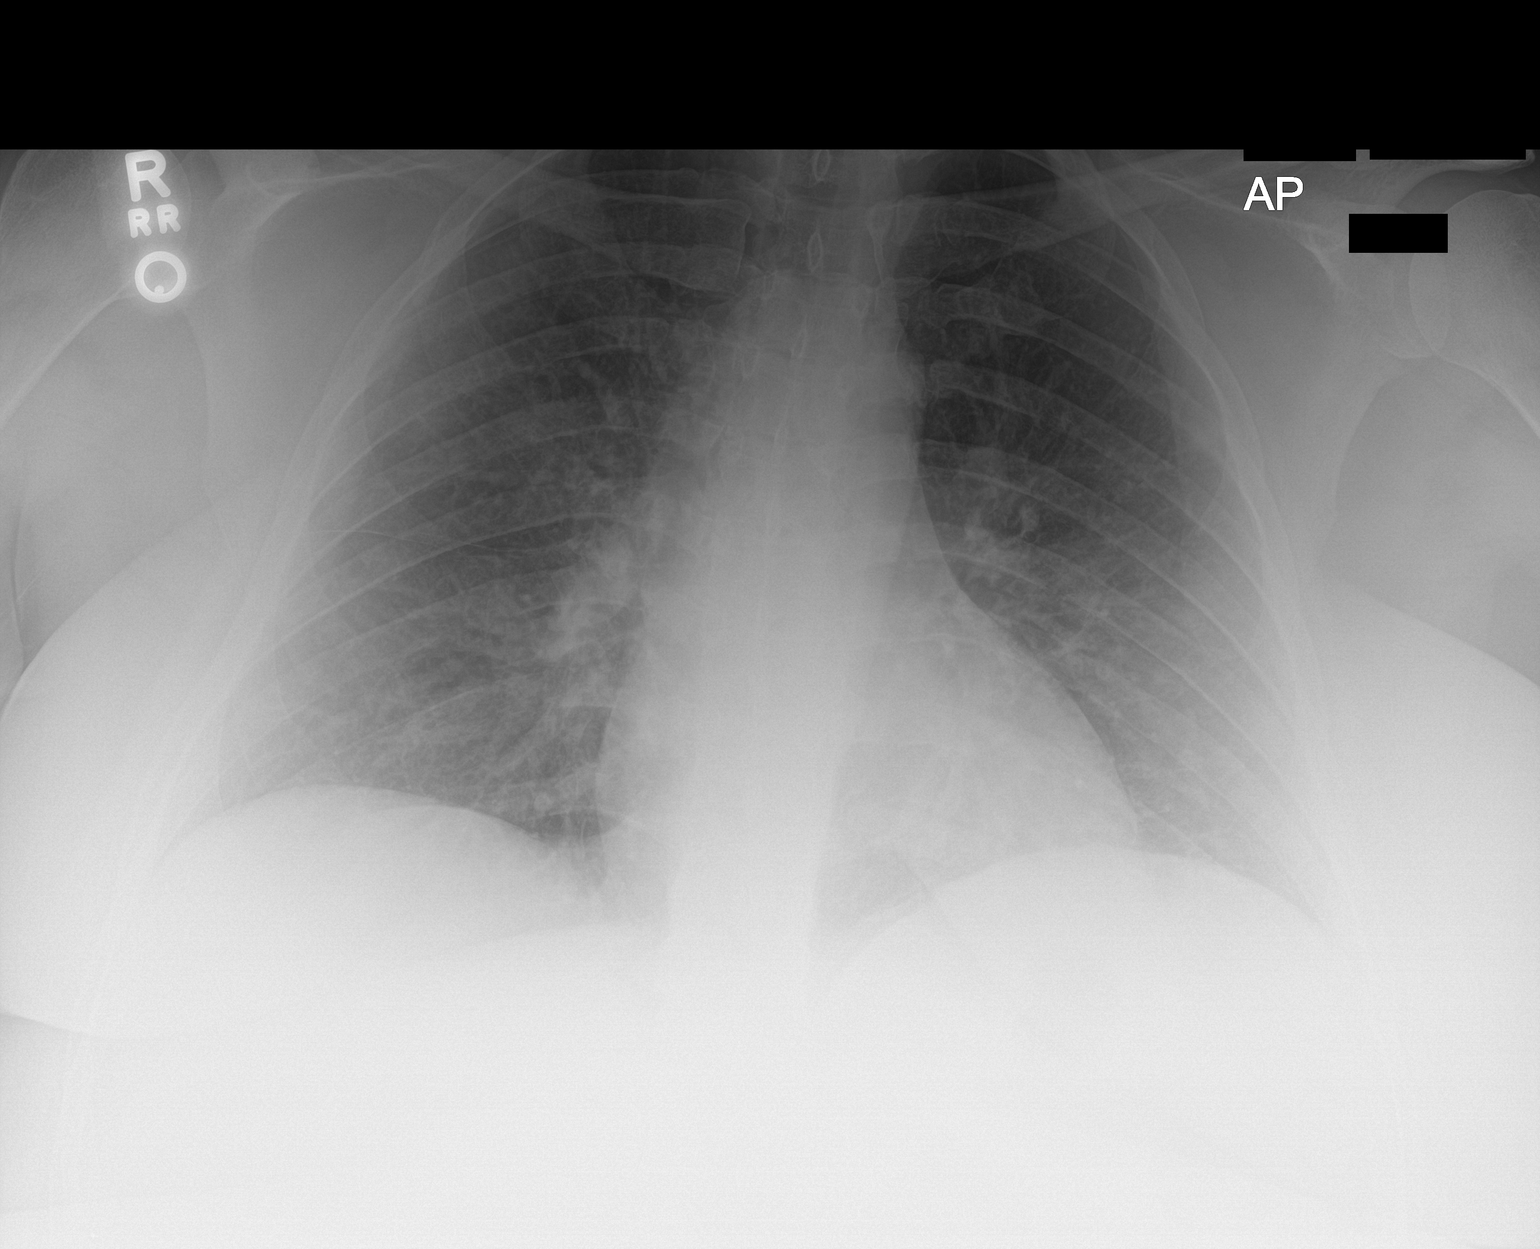

[1 of 1 positions shown; findings below may reference images not displayed]

FINDINGS: The heart size and mediastinal contours are within normal limits. No
focal airspace consolidation, pleural effusion, or pneumothorax. The
visualized skeletal structures are unremarkable.
IMPRESSION: No active disease.
